# Patient Record
Sex: Female | Born: 1985 | Race: Black or African American | Hispanic: No | Marital: Married | Smoking: Never smoker
Health system: Southern US, Community
[De-identification: ages and names within clinical notes are randomized; demographics above are authoritative.]

## PROBLEM LIST (undated history)

## (undated) DIAGNOSIS — F32A Depression, unspecified: Secondary | ICD-10-CM

## (undated) DIAGNOSIS — D649 Anemia, unspecified: Secondary | ICD-10-CM

## (undated) DIAGNOSIS — F329 Major depressive disorder, single episode, unspecified: Secondary | ICD-10-CM

## (undated) DIAGNOSIS — R519 Headache, unspecified: Secondary | ICD-10-CM

## (undated) HISTORY — DX: Headache, unspecified: R51.9

## (undated) HISTORY — DX: Depression, unspecified: F32.A

## (undated) HISTORY — PX: NO PAST SURGERIES: SHX2092

---
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
## 2018-09-25 LAB — OB RESULTS CONSOLE HEPATITIS B SURFACE ANTIGEN: Hepatitis B Surface Ag: NEGATIVE

## 2018-09-25 LAB — OB RESULTS CONSOLE ANTIBODY SCREEN: Antibody Screen: NEGATIVE

## 2018-09-25 LAB — OB RESULTS CONSOLE ABO/RH: RH Type: POSITIVE

## 2018-09-25 LAB — OB RESULTS CONSOLE RUBELLA ANTIBODY, IGM: Rubella: IMMUNE

## 2018-09-25 LAB — OB RESULTS CONSOLE GC/CHLAMYDIA
Chlamydia: NEGATIVE
Gonorrhea: NEGATIVE

## 2018-09-25 LAB — OB RESULTS CONSOLE RPR: RPR: NONREACTIVE

## 2018-09-25 LAB — OB RESULTS CONSOLE HIV ANTIBODY (ROUTINE TESTING): HIV: NONREACTIVE

## 2018-11-13 NOTE — L&D Delivery Note (Signed)
Delivery Note At 2:54 PM a viable female was delivered via  (Presentation: LOA).  APGAR: 9, 9; weight pending.   Placenta status: S, I. 3V Cord with the following complications: none.  Cord pH: n/a  Anesthesia:  CLEA Episiotomy:  n/a Lacerations:  none Suture Repair: n/a Est. Blood Loss (mL):  100  Mom to postpartum.  Baby to Couplet care / Skin to Skin.  Linda Hedges 04/28/2019, 3:15 PM

## 2018-11-15 ENCOUNTER — Inpatient Hospital Stay (HOSPITAL_COMMUNITY)
Admission: AD | Admit: 2018-11-15 | Discharge: 2018-11-15 | Disposition: A | Discharge status: Home / Self Care | Payer: 59 | Source: Ambulatory Visit | Attending: Obstetrics and Gynecology | Admitting: Obstetrics and Gynecology

## 2018-11-15 ENCOUNTER — Other Ambulatory Visit: Payer: Self-pay

## 2018-11-15 ENCOUNTER — Encounter (HOSPITAL_COMMUNITY): Payer: Self-pay | Admitting: *Deleted

## 2018-11-15 DIAGNOSIS — Z3A16 16 weeks gestation of pregnancy: Secondary | ICD-10-CM | POA: Insufficient documentation

## 2018-11-15 DIAGNOSIS — O26892 Other specified pregnancy related conditions, second trimester: Secondary | ICD-10-CM | POA: Diagnosis not present

## 2018-11-15 DIAGNOSIS — R102 Pelvic and perineal pain: Secondary | ICD-10-CM | POA: Insufficient documentation

## 2018-11-15 DIAGNOSIS — O26899 Other specified pregnancy related conditions, unspecified trimester: Secondary | ICD-10-CM | POA: Diagnosis present

## 2018-11-15 HISTORY — DX: Anemia, unspecified: D64.9

## 2018-11-15 LAB — URINALYSIS, ROUTINE W REFLEX MICROSCOPIC
Bacteria, UA: NONE SEEN
Bilirubin Urine: NEGATIVE
Glucose, UA: NEGATIVE mg/dL
Hgb urine dipstick: NEGATIVE
Ketones, ur: NEGATIVE mg/dL
Nitrite: NEGATIVE
Protein, ur: NEGATIVE mg/dL
Specific Gravity, Urine: 1.019 (ref 1.005–1.030)
pH: 6 (ref 5.0–8.0)

## 2018-11-15 MED ORDER — OXYCODONE-ACETAMINOPHEN 5-325 MG PO TABS: 1.0000 | ORAL_TABLET | Freq: Four times a day (QID) | ORAL | 0 refills | Status: DC | PRN

## 2018-11-15 MED ORDER — ACETAMINOPHEN 500 MG PO TABS
1000.0000 mg | ORAL_TABLET | Freq: Once | ORAL | Status: AC
  Administered 2018-11-15: 1000 mg via ORAL
  Filled 2018-11-15: qty 2

## 2018-11-15 NOTE — Discharge Instructions (Signed)
You have been sent a prescription for a stronger pain medication to use on a as needed basis for when the pain is NOT relieved by Tylenol. Be sure not to use in combination with Tylenol or Fioricet, because all 3 of these medications contain Tylenol.  Round Ligament Pain During Pregnancy Round ligament pain is a sharp pain or jabbing feeling often felt in the lower belly or groin area on one or both sides. It is one of the most common complaints during pregnancy and is considered a normal part of pregnancy. It is most often felt during the second trimester.  Here is what you need to know about round ligament pain, including some tips to help you feel better.  Causes of Round Ligament Pain  Several thick ligaments surround and support your womb (uterus) as it grows during pregnancy. One of them is called the round ligament.  The round ligament connects the front part of the womb to your groin, the area where your legs attach to your pelvis. The round ligament normally tightens and relaxes slowly.  As your baby and womb grow, the round ligament stretches. That makes it more likely to become strained.  Sudden movements can cause the ligament to tighten quickly, like a rubber band snapping. This causes a sudden and quick jabbing feeling.  Symptoms of Round Ligament Pain  Round ligament pain can be concerning and uncomfortable. But it is considered normal as your body changes during pregnancy.  The symptoms of round ligament pain include a sharp, sudden spasm in the belly. It usually affects the right side, but it may happen on both sides. The pain only lasts a few seconds.  Exercise may cause the pain, as will rapid movements such as:  sneezing coughing laughing rolling over in bed standing up too quickly  Comfort Measures:  Soak in a warm tub Tylenol 1000 mg by mouth every 6-8 hrs as needed for pain Slow position changes Laying on the affected side Massage lower pelvic region before  getting out of bed every morning and after going to bed at night Sit with one leg on a chair and the other leg off the chair stretched behind you to stretch the groin area; switch legs and stretch the other groin area; do this twice daily

## 2018-11-15 NOTE — MAU Provider Note (Signed)
History     CSN: [id number]  Arrival date and time: 11/15/18 7829   First Provider Initiated Contact with Patient 11/15/18 2109      Chief Complaint  Patient presents with  . Abdominal Pain   HPI  Ms.  Maureen Bradshaw is a 33 y.o. year old G66P1001 female at [redacted]w[redacted]d weeks gestation who presents to MAU reporting mid lower abdominal pain/cramping since yesterday at work. She reports the pain is mainly when she stands and walking. She denies VB or LOF. She is scheduled to see Dr. Langston Masker on Thursday 11/21/2018.  Past Medical History:  Diagnosis Date  . Anemia     Past Surgical History:  Procedure Laterality Date  . NO PAST SURGERIES      No family history on file.  Social History   Tobacco Use  . Smoking status: Never Smoker  . Smokeless tobacco: Never Used  Substance Use Topics  . Alcohol use: Never    Frequency: Never  . Drug use: Never    Allergies: No Known Allergies  No medications prior to admission.    Review of Systems  Constitutional: Negative.   HENT: Negative.   Eyes: Negative.   Respiratory: Negative.   Cardiovascular: Negative.   Gastrointestinal: Negative.   Endocrine: Negative.   Genitourinary: Positive for pelvic pain (lower, pressure, increases with standing and walking). Negative for vaginal bleeding.  Musculoskeletal: Negative.   Skin: Negative.   Allergic/Immunologic: Negative.   Neurological: Negative.   Hematological: Negative.   Psychiatric/Behavioral: Negative.    Physical Exam   Blood pressure (!) 125/55, pulse 75, temperature 98.6 F (37 C), temperature source Oral, resp. rate 18, weight 93.8 kg, SpO2 100 %.  Physical Exam  Nursing note and vitals reviewed. Constitutional: She is oriented to person, place, and time. She appears well-developed and well-nourished.  HENT:  Head: Normocephalic and atraumatic.  Eyes: Pupils are equal, round, and reactive to light.  Neck: Normal range of motion.  Cardiovascular: Normal rate and  regular rhythm.  Respiratory: Effort normal.  GI: Soft.  Genitourinary:    Genitourinary Comments: Mild tenderness with palpation in lower mid pelvic area, pt declines WP & GC/CT   Musculoskeletal: Normal range of motion.  Neurological: She is alert and oriented to person, place, and time.  Skin: Skin is warm and dry.  Psychiatric: She has a normal mood and affect. Her behavior is normal. Judgment and thought content normal.    MAU Course  Procedures Informal BS U/S Patient informed that the ultrasound is considered a limited OB ultrasound and is not intended to be a complete ultrasound exam.  Patient also informed that the ultrasound is not being completed with the intent of assessing for fetal or placental anomalies or any pelvic abnormalities.  Explained that the purpose of today's ultrasound is to assess for viability - very active fetus seen. Parents reassured.  Patient acknowledges the purpose of the exam and the limitations of the study.  MDM CCUA FHTs by doppler: 152 bpm  BS U/S  Tylenol 1000 mg   Results for orders placed or performed during the hospital encounter of 11/15/18 (from the past 24 hour(s))  Urinalysis, Routine w reflex microscopic     Status: Abnormal   Collection Time: 11/15/18  6:50 PM  Result Value Ref Range   Color, Urine YELLOW YELLOW   APPearance HAZY (A) CLEAR   Specific Gravity, Urine 1.019 1.005 - 1.030   pH 6.0 5.0 - 8.0   Glucose, UA NEGATIVE NEGATIVE mg/dL  Hgb urine dipstick NEGATIVE NEGATIVE   Bilirubin Urine NEGATIVE NEGATIVE   Ketones, ur NEGATIVE NEGATIVE mg/dL   Protein, ur NEGATIVE NEGATIVE mg/dL   Nitrite NEGATIVE NEGATIVE   Leukocytes, UA SMALL (A) NEGATIVE   RBC / HPF 0-5 0 - 5 RBC/hpf   WBC, UA 11-20 0 - 5 WBC/hpf   Bacteria, UA NONE SEEN NONE SEEN   Squamous Epithelial / LPF 6-10 0 - 5   Mucus PRESENT      Assessment and Plan  Pain of round ligament affecting pregnancy, antepartum  - Rx for Percocet 5/325 mg for if Tylenol  dosing does not help RLP - Comfort measures discussed for RLP, demonstrations given - Information provided on RLP & safe meds in pregnancy list  - Discharge patient - Keep scheduled appt with P4W on Thursday 11/21/2018 - Patient verbalized an understanding of the plan of care and agrees.    Raelyn Mora, MSN, CNM 11/15/2018, 9:09 PM

## 2018-11-15 NOTE — MAU Note (Signed)
Urine in lab 

## 2018-11-15 NOTE — MAU Note (Signed)
Been having a lot of discomfort in lower abd, started yesterday when at work.  Mainly when she is standing and walking.

## 2019-01-31 ENCOUNTER — Other Ambulatory Visit (HOSPITAL_COMMUNITY): Payer: Self-pay | Admitting: *Deleted

## 2019-02-03 ENCOUNTER — Ambulatory Visit (HOSPITAL_COMMUNITY)
Admission: RE | Admit: 2019-02-03 | Discharge: 2019-02-03 | Disposition: A | Discharge status: Home / Self Care | Payer: 59 | Source: Ambulatory Visit | Attending: Obstetrics and Gynecology | Admitting: Obstetrics and Gynecology

## 2019-02-03 ENCOUNTER — Other Ambulatory Visit: Payer: Self-pay

## 2019-02-03 DIAGNOSIS — D509 Iron deficiency anemia, unspecified: Secondary | ICD-10-CM | POA: Insufficient documentation

## 2019-02-03 MED ORDER — SODIUM CHLORIDE 0.9 % IV SOLN
510.0000 mg | INTRAVENOUS | Status: DC
  Administered 2019-02-03: 510 mg via INTRAVENOUS
  Filled 2019-02-03: qty 510

## 2019-02-03 NOTE — Discharge Instructions (Signed)

## 2019-02-06 ENCOUNTER — Ambulatory Visit (HOSPITAL_COMMUNITY)
Admission: RE | Admit: 2019-02-06 | Discharge: 2019-02-06 | Disposition: A | Discharge status: Home / Self Care | Payer: 59 | Source: Ambulatory Visit | Attending: Obstetrics and Gynecology | Admitting: Obstetrics and Gynecology

## 2019-02-06 ENCOUNTER — Other Ambulatory Visit: Payer: Self-pay

## 2019-02-06 DIAGNOSIS — D509 Iron deficiency anemia, unspecified: Secondary | ICD-10-CM | POA: Diagnosis not present

## 2019-02-06 DIAGNOSIS — O99019 Anemia complicating pregnancy, unspecified trimester: Secondary | ICD-10-CM | POA: Insufficient documentation

## 2019-02-06 MED ORDER — SODIUM CHLORIDE 0.9 % IV SOLN
510.0000 mg | INTRAVENOUS | Status: AC
  Administered 2019-02-06: 510 mg via INTRAVENOUS
  Filled 2019-02-06: qty 17

## 2019-02-12 ENCOUNTER — Encounter (HOSPITAL_COMMUNITY): Payer: 59

## 2019-04-04 LAB — OB RESULTS CONSOLE GBS: GBS: POSITIVE

## 2019-04-15 ENCOUNTER — Telehealth (HOSPITAL_COMMUNITY): Payer: Self-pay | Admitting: *Deleted

## 2019-04-15 ENCOUNTER — Encounter (HOSPITAL_COMMUNITY): Payer: Self-pay | Admitting: *Deleted

## 2019-04-16 ENCOUNTER — Encounter (HOSPITAL_COMMUNITY): Payer: Self-pay | Admitting: *Deleted

## 2019-04-16 NOTE — Telephone Encounter (Signed)
Preadmission screen  

## 2019-04-24 ENCOUNTER — Other Ambulatory Visit (HOSPITAL_COMMUNITY)
Admission: RE | Admit: 2019-04-24 | Discharge: 2019-04-24 | Disposition: A | Discharge status: Home / Self Care | Payer: 59 | Source: Ambulatory Visit | Attending: Obstetrics and Gynecology | Admitting: Obstetrics and Gynecology

## 2019-04-24 ENCOUNTER — Other Ambulatory Visit: Payer: Self-pay

## 2019-04-24 DIAGNOSIS — Z1159 Encounter for screening for other viral diseases: Secondary | ICD-10-CM | POA: Diagnosis present

## 2019-04-24 NOTE — MAU Note (Signed)
Asymptomatic, swab collected without problem. 

## 2019-04-25 ENCOUNTER — Other Ambulatory Visit (HOSPITAL_COMMUNITY): Payer: Self-pay | Admitting: *Deleted

## 2019-04-25 LAB — NOVEL CORONAVIRUS, NAA (HOSP ORDER, SEND-OUT TO REF LAB; TAT 18-24 HRS): SARS-CoV-2, NAA: NOT DETECTED

## 2019-04-28 ENCOUNTER — Inpatient Hospital Stay (HOSPITAL_COMMUNITY)
Admission: AD | Admit: 2019-04-28 | Discharge: 2019-04-29 | DRG: 807 | Disposition: A | Discharge status: Home / Self Care | Payer: No Typology Code available for payment source | Attending: Obstetrics & Gynecology | Admitting: Obstetrics & Gynecology

## 2019-04-28 ENCOUNTER — Inpatient Hospital Stay (HOSPITAL_COMMUNITY): Payer: No Typology Code available for payment source | Admitting: Anesthesiology

## 2019-04-28 ENCOUNTER — Inpatient Hospital Stay (HOSPITAL_COMMUNITY): Payer: No Typology Code available for payment source

## 2019-04-28 ENCOUNTER — Encounter (HOSPITAL_COMMUNITY): Payer: Self-pay | Admitting: *Deleted

## 2019-04-28 ENCOUNTER — Other Ambulatory Visit: Payer: Self-pay

## 2019-04-28 DIAGNOSIS — O99824 Streptococcus B carrier state complicating childbirth: Principal | ICD-10-CM | POA: Diagnosis present

## 2019-04-28 DIAGNOSIS — Z3A39 39 weeks gestation of pregnancy: Secondary | ICD-10-CM

## 2019-04-28 DIAGNOSIS — O26893 Other specified pregnancy related conditions, third trimester: Secondary | ICD-10-CM | POA: Diagnosis present

## 2019-04-28 DIAGNOSIS — Z3490 Encounter for supervision of normal pregnancy, unspecified, unspecified trimester: Secondary | ICD-10-CM

## 2019-04-28 LAB — ABO/RH: ABO/RH(D): O POS

## 2019-04-28 LAB — TYPE AND SCREEN
ABO/RH(D): O POS
Antibody Screen: NEGATIVE

## 2019-04-28 LAB — CBC
HCT: 34.7 % — ABNORMAL LOW (ref 36.0–46.0)
Hemoglobin: 11.3 g/dL — ABNORMAL LOW (ref 12.0–15.0)
MCH: 26.7 pg (ref 26.0–34.0)
MCHC: 32.6 g/dL (ref 30.0–36.0)
MCV: 82 fL (ref 80.0–100.0)
Platelets: 249 10*3/uL (ref 150–400)
RBC: 4.23 MIL/uL (ref 3.87–5.11)
RDW: 16.5 % — ABNORMAL HIGH (ref 11.5–15.5)
WBC: 7.5 10*3/uL (ref 4.0–10.5)
nRBC: 0 % (ref 0.0–0.2)

## 2019-04-28 MED ORDER — PHENYLEPHRINE 40 MCG/ML (10ML) SYRINGE FOR IV PUSH (FOR BLOOD PRESSURE SUPPORT): 80.0000 ug | PREFILLED_SYRINGE | INTRAVENOUS | Status: DC | PRN

## 2019-04-28 MED ORDER — FLEET ENEMA 7-19 GM/118ML RE ENEM: 1.0000 | ENEMA | RECTAL | Status: DC | PRN

## 2019-04-28 MED ORDER — OXYTOCIN 40 UNITS IN NORMAL SALINE INFUSION - SIMPLE MED: 2.5000 [IU]/h | INTRAVENOUS | Status: DC

## 2019-04-28 MED ORDER — ACETAMINOPHEN 325 MG PO TABS: 650.0000 mg | ORAL_TABLET | ORAL | Status: DC | PRN

## 2019-04-28 MED ORDER — IBUPROFEN 600 MG PO TABS
600.0000 mg | ORAL_TABLET | Freq: Four times a day (QID) | ORAL | Status: DC
  Administered 2019-04-28 – 2019-04-29 (×5): 600 mg via ORAL
  Filled 2019-04-28 (×5): qty 1

## 2019-04-28 MED ORDER — LIDOCAINE HCL (PF) 1 % IJ SOLN: 30.0000 mL | INTRAMUSCULAR | Status: DC | PRN

## 2019-04-28 MED ORDER — OXYCODONE-ACETAMINOPHEN 5-325 MG PO TABS: 2.0000 | ORAL_TABLET | ORAL | Status: DC | PRN

## 2019-04-28 MED ORDER — DIPHENHYDRAMINE HCL 25 MG PO CAPS: 25.0000 mg | ORAL_CAPSULE | Freq: Four times a day (QID) | ORAL | Status: DC | PRN

## 2019-04-28 MED ORDER — DIBUCAINE (PERIANAL) 1 % EX OINT: 1.0000 "application " | TOPICAL_OINTMENT | CUTANEOUS | Status: DC | PRN

## 2019-04-28 MED ORDER — COCONUT OIL OIL
1.0000 "application " | TOPICAL_OIL | Status: DC | PRN
  Administered 2019-04-29: 1 via TOPICAL

## 2019-04-28 MED ORDER — OXYCODONE-ACETAMINOPHEN 5-325 MG PO TABS: 1.0000 | ORAL_TABLET | ORAL | Status: DC | PRN

## 2019-04-28 MED ORDER — LACTATED RINGERS IV SOLN: 500.0000 mL | INTRAVENOUS | Status: DC | PRN

## 2019-04-28 MED ORDER — SODIUM CHLORIDE (PF) 0.9 % IJ SOLN
INTRAMUSCULAR | Status: DC | PRN
  Administered 2019-04-28: 12 mL/h via EPIDURAL

## 2019-04-28 MED ORDER — SOD CITRATE-CITRIC ACID 500-334 MG/5ML PO SOLN: 30.0000 mL | ORAL | Status: DC | PRN

## 2019-04-28 MED ORDER — OXYTOCIN BOLUS FROM INFUSION
500.0000 mL | Freq: Once | INTRAVENOUS | Status: AC
  Administered 2019-04-28: 500 mL via INTRAVENOUS

## 2019-04-28 MED ORDER — SENNOSIDES-DOCUSATE SODIUM 8.6-50 MG PO TABS
2.0000 | ORAL_TABLET | ORAL | Status: DC
  Administered 2019-04-29: 01:00:00 2 via ORAL
  Filled 2019-04-28: qty 2

## 2019-04-28 MED ORDER — PRENATAL MULTIVITAMIN CH
1.0000 | ORAL_TABLET | Freq: Every day | ORAL | Status: DC
  Administered 2019-04-29: 11:00:00 1 via ORAL
  Filled 2019-04-28: qty 1

## 2019-04-28 MED ORDER — TETANUS-DIPHTH-ACELL PERTUSSIS 5-2.5-18.5 LF-MCG/0.5 IM SUSP: 0.5000 mL | Freq: Once | INTRAMUSCULAR | Status: DC

## 2019-04-28 MED ORDER — BENZOCAINE-MENTHOL 20-0.5 % EX AERO
1.0000 "application " | INHALATION_SPRAY | CUTANEOUS | Status: DC | PRN
  Administered 2019-04-28: 1 via TOPICAL
  Filled 2019-04-28: qty 56

## 2019-04-28 MED ORDER — EPHEDRINE 5 MG/ML INJ: 10.0000 mg | INTRAVENOUS | Status: DC | PRN

## 2019-04-28 MED ORDER — LACTATED RINGERS IV SOLN: 500.0000 mL | Freq: Once | INTRAVENOUS | Status: DC

## 2019-04-28 MED ORDER — FENTANYL CITRATE (PF) 100 MCG/2ML IJ SOLN
50.0000 ug | INTRAMUSCULAR | Status: DC | PRN
  Administered 2019-04-28: 14:00:00 100 ug via INTRAVENOUS
  Filled 2019-04-28: qty 2

## 2019-04-28 MED ORDER — LACTATED RINGERS IV SOLN
INTRAVENOUS | Status: DC
  Administered 2019-04-28 (×2): via INTRAVENOUS

## 2019-04-28 MED ORDER — SIMETHICONE 80 MG PO CHEW: 80.0000 mg | CHEWABLE_TABLET | ORAL | Status: DC | PRN

## 2019-04-28 MED ORDER — ONDANSETRON HCL 4 MG/2ML IJ SOLN: 4.0000 mg | Freq: Four times a day (QID) | INTRAMUSCULAR | Status: DC | PRN

## 2019-04-28 MED ORDER — OXYTOCIN 40 UNITS IN NORMAL SALINE INFUSION - SIMPLE MED
1.0000 m[IU]/min | INTRAVENOUS | Status: DC
  Administered 2019-04-28: 08:00:00 2 m[IU]/min via INTRAVENOUS
  Filled 2019-04-28: qty 1000

## 2019-04-28 MED ORDER — TERBUTALINE SULFATE 1 MG/ML IJ SOLN: 0.2500 mg | Freq: Once | INTRAMUSCULAR | Status: DC | PRN

## 2019-04-28 MED ORDER — SODIUM CHLORIDE 0.9 % IV SOLN
5.0000 10*6.[IU] | Freq: Once | INTRAVENOUS | Status: AC
  Administered 2019-04-28: 08:00:00 5 10*6.[IU] via INTRAVENOUS
  Filled 2019-04-28: qty 5

## 2019-04-28 MED ORDER — FENTANYL-BUPIVACAINE-NACL 0.5-0.125-0.9 MG/250ML-% EP SOLN
12.0000 mL/h | EPIDURAL | Status: DC | PRN
  Filled 2019-04-28: qty 250

## 2019-04-28 MED ORDER — FENTANYL CITRATE (PF) 100 MCG/2ML IJ SOLN
INTRAMUSCULAR | Status: DC | PRN
  Administered 2019-04-28: 100 ug via EPIDURAL

## 2019-04-28 MED ORDER — ONDANSETRON HCL 4 MG PO TABS: 4.0000 mg | ORAL_TABLET | ORAL | Status: DC | PRN

## 2019-04-28 MED ORDER — BUPIVACAINE HCL (PF) 0.25 % IJ SOLN
INTRAMUSCULAR | Status: DC | PRN
  Administered 2019-04-28: 8 mL via EPIDURAL

## 2019-04-28 MED ORDER — ONDANSETRON HCL 4 MG/2ML IJ SOLN: 4.0000 mg | INTRAMUSCULAR | Status: DC | PRN

## 2019-04-28 MED ORDER — ZOLPIDEM TARTRATE 5 MG PO TABS: 5.0000 mg | ORAL_TABLET | Freq: Every evening | ORAL | Status: DC | PRN

## 2019-04-28 MED ORDER — WITCH HAZEL-GLYCERIN EX PADS: 1.0000 "application " | MEDICATED_PAD | CUTANEOUS | Status: DC | PRN

## 2019-04-28 MED ORDER — LIDOCAINE HCL (PF) 1 % IJ SOLN
INTRAMUSCULAR | Status: DC | PRN
  Administered 2019-04-28: 5 mL via EPIDURAL

## 2019-04-28 MED ORDER — PENICILLIN G 3 MILLION UNITS IVPB - SIMPLE MED
3.0000 10*6.[IU] | INTRAVENOUS | Status: DC
  Administered 2019-04-28: 12:00:00 3 10*6.[IU] via INTRAVENOUS
  Filled 2019-04-28: qty 100

## 2019-04-28 MED ORDER — DIPHENHYDRAMINE HCL 50 MG/ML IJ SOLN: 12.5000 mg | INTRAMUSCULAR | Status: DC | PRN

## 2019-04-28 NOTE — Lactation Note (Addendum)
This note was copied from a baby's chart. Lactation Consultation Note  Patient Name: Maureen Bradshaw [id number] Date: 04/28/2019 Reason for consult: 1st time breastfeeding;Term P2, 5 hour female infant. Mom breastfeeding 3 x times since birth.  Per mom, infant had 3 stools and one void. Mom feels breastfeeding is going well. This is Mom's first time breastfeeding she formula feed her 35 year old son, due to  lack of support with  nursing staff not  being helpful, Mom  delivered her son at a different hospital. Mom is active on the [facility] program in [facility] and has an DEBP at home.  Mom knows how to hand express her Nurse helped her with learning how to hand express. Mom latched infant on left breast using the cross cradle hold, infant latched chin first with wide mouth gape, nose slightly touching breast. Few swallows observed by [place] infant was still breastfeeding (5 minutes) when [facility] left the room.  Mom knows to breastfeed according hunger cues, 8 to 12 times within 24 hours and on demand. LC discussed I & O. Mom knows to call Nurse or [place] if she has any questions, concerns or need assistance with latching infant to breast. Mom made aware of O/P services, breastfeeding support groups, community resources, and our phone # for post-discharge questions.  Maternal Data Formula Feeding for Exclusion: No Has patient been taught Hand Expression?: Yes Does the patient have breastfeeding experience prior to this delivery?: No  Feeding Feeding Type: Breast Fed  LATCH Score Latch: Grasps breast easily, tongue down, lips flanged, rhythmical sucking.  Audible Swallowing: Spontaneous and intermittent  Type of Nipple: Everted at rest and after stimulation  Comfort (Breast/Nipple): Soft / non-tender  Hold (Positioning): Assistance needed to correctly position infant at breast and maintain latch.  LATCH Score: 9  Interventions Interventions: Breast feeding basics reviewed;Breast  compression;Assisted with latch;Skin to skin;Position options;Support pillows;Adjust position  Lactation Tools Discussed/Used WIC Program: Yes   Consult Status Consult Status: Follow-up Date: 04/29/19 Follow-up type: In-patient    Vicente Serene 04/28/2019, 8:40 PM

## 2019-04-28 NOTE — H&P (Signed)
Maureen Bradshaw is a 33 y.o. female G2P1001 at 53 weeks presenting for elective IOL.  Antepartum course uncomplicated.  H/O PPD.  GBS positive.  OB History    Gravida  2   Para  1   Term  1   Preterm      AB      Living  1     SAB      TAB      Ectopic      Multiple      Live Births  1          Past Medical History:  Diagnosis Date  . Anemia   . Depression    hx PPD untreated  . Headache    Past Surgical History:  Procedure Laterality Date  . NO PAST SURGERIES     Family History: family history includes Breast cancer in her mother; Heart disease in her father; Hypertension in her father and mother; Lung cancer in her paternal grandfather; Thyroid disease in her mother. Social History:  reports that she has never smoked. She has never used smokeless tobacco. She reports that she does not drink alcohol or use drugs.     Maternal Diabetes: No Genetic Screening: Normal Maternal Ultrasounds/Referrals: Normal Fetal Ultrasounds or other Referrals:  None Maternal Substance Abuse:  No Significant Maternal Medications:  None Significant Maternal Lab Results:  Lab values include: Group B Strep positive Other Comments:  None  ROS Maternal Medical History:  Contractions: Onset was 1-2 hours ago.   Frequency: irregular.   Perceived severity is mild.    Fetal activity: Perceived fetal activity is normal.   Last perceived fetal movement was within the past hour.    Prenatal complications: no prenatal complications Prenatal Complications - Diabetes: none.    Dilation: 3.5 Effacement (%): 60 Station: -2 Exam by:: J.Cox, RN Blood pressure 116/66, pulse 67, height 5\' 6"  (1.676 m), weight 95.8 kg, last menstrual period 07/26/2018. Maternal Exam:  Uterine Assessment: Contraction strength is mild.  Contraction frequency is rare.   Abdomen: Fundal height is c/w dates.   Estimated fetal weight is 7#8.       Fetal Exam Fetal Monitor Review: Mode: ultrasound.    Baseline rate: 145.  Variability: moderate (6-25 bpm).   Pattern: no decelerations and accelerations present.    Fetal State Assessment: Category I - tracings are normal.     Physical Exam  Constitutional: She is oriented to person, place, and time. She appears well-developed and well-nourished.  GI: Soft. There is no abdominal tenderness. There is no rebound and no guarding.  Neurological: She is alert and oriented to person, place, and time.  Skin: Skin is warm and dry.  Psychiatric: She has a normal mood and affect. Her behavior is normal.    Prenatal labs: ABO, Rh: O/Positive/-- (11/13 0000) Antibody: Negative (11/13 0000) Rubella: Immune (11/13 0000) RPR: Nonreactive (11/13 0000)  HBsAg: Negative (11/13 0000)  HIV: Non-reactive (11/13 0000)  GBS: Positive (05/22 0000)   Assessment/Plan: 33yo G2P1001 at 39w for elective IOL -GBS positive-PCN for ppx -Epidural when ready -AROM after first dose abx infused -Anticipate NSVD   Linda Hedges 04/28/2019, 8:17 AM

## 2019-04-28 NOTE — Progress Notes (Signed)
Comfortable with CLEA SVE 3-4/50/-2, AROM, scant clear fluid Continue pitocin FHT Cat I  Linda Hedges, DO

## 2019-04-28 NOTE — Anesthesia Procedure Notes (Signed)

## 2019-04-28 NOTE — Anesthesia Preprocedure Evaluation (Signed)
Anesthesia Evaluation  Patient identified by MRN, date of birth, ID band Patient awake    Reviewed: Allergy & Precautions, NPO status   Airway Mallampati: II  TM Distance: >3 FB Neck ROM: Full    Dental no notable dental hx.    Pulmonary neg pulmonary ROS,    Pulmonary exam normal breath sounds clear to auscultation       Cardiovascular Exercise Tolerance: Good negative cardio ROS Normal cardiovascular exam Rhythm:Regular Rate:Normal     Neuro/Psych  Headaches, Depression    GI/Hepatic   Endo/Other    Renal/GU      Musculoskeletal   Abdominal   Peds  Hematology Hgb 11.3 Plt 249   Anesthesia Other Findings   Reproductive/Obstetrics (+) Pregnancy                             Anesthesia Physical Anesthesia Plan  ASA: II  Anesthesia Plan: Epidural   Post-op Pain Management:    Induction:   PONV Risk Score and Plan:   Airway Management Planned:   Additional Equipment:   Intra-op Plan:   Post-operative Plan:   Informed Consent: I have reviewed the patients History and Physical, chart, labs and discussed the procedure including the risks, benefits and alternatives for the proposed anesthesia with the patient or authorized representative who has indicated his/her understanding and acceptance.       Plan Discussed with:   Anesthesia Plan Comments: (For LEA)        Anesthesia Quick Evaluation

## 2019-04-29 ENCOUNTER — Ambulatory Visit: Payer: Self-pay

## 2019-04-29 LAB — CBC
HCT: 31.9 % — ABNORMAL LOW (ref 36.0–46.0)
Hemoglobin: 10.1 g/dL — ABNORMAL LOW (ref 12.0–15.0)
MCH: 26.2 pg (ref 26.0–34.0)
MCHC: 31.7 g/dL (ref 30.0–36.0)
MCV: 82.9 fL (ref 80.0–100.0)
Platelets: 205 10*3/uL (ref 150–400)
RBC: 3.85 MIL/uL — ABNORMAL LOW (ref 3.87–5.11)
RDW: 16.4 % — ABNORMAL HIGH (ref 11.5–15.5)
WBC: 10.1 10*3/uL (ref 4.0–10.5)
nRBC: 0 % (ref 0.0–0.2)

## 2019-04-29 LAB — RPR: RPR Ser Ql: NONREACTIVE

## 2019-04-29 NOTE — Lactation Note (Signed)
This note was copied from a baby's chart. Lactation Consultation Note  Patient Name: Maureen Bradshaw [id number] Date: 04/29/2019   Infant has bili blanket for bili level in high risk zone. Mom had attempted to latch 30 minutes prior to consult, but "Maureen Bradshaw" just wanted to sleep.   Mom reported that when infant last received a bottle, the flow seemed a little fast. I observed infant with the Similac slow-flow nipple & it did seem too fast. I tried with the Enfamil extra slow-flow nipple & "Maureen Bradshaw" did very well. I suggested that parents allow Maureen Bradshaw to feed in an upright position with the Enfamil extra slow-flow nipple until she is comfortably full.   Mom last pumped at 0900 this morning. Mom will pump whenever infant receives formula. She is preparing to pump now. Mom had said that her nipples were sore. I looked and it appears that her size 24 flanges would be appropriate for her when pumping. RN will bring in coconut oil so that Mom can lubricate flanges to see if that helps. Mom was shown how to assemble & use hand pump (single- & double-mode) that was included in pump kit. Mom has a Spectra 2 pump at home.   [name], RN was given update.   Matthias Hughs [facility] 04/29/2019, 12:33 PM

## 2019-04-29 NOTE — Anesthesia Postprocedure Evaluation (Signed)
Anesthesia Post Note  Patient: Maureen Bradshaw  Procedure(s) Performed: AN AD HOC LABOR EPIDURAL     Patient location during evaluation: Mother Baby Anesthesia Type: Epidural Level of consciousness: awake and alert Pain management: pain level controlled Vital Signs Assessment: post-procedure vital signs reviewed and stable Respiratory status: spontaneous breathing, nonlabored ventilation and respiratory function stable Cardiovascular status: stable Postop Assessment: no headache, no backache and epidural receding Anesthetic complications: no    Last Vitals:  Vitals:   04/29/19 0100 04/29/19 0500  BP: 109/66 110/60  Pulse: 77 70  Resp: 16 16  Temp: 36.6 C 36.7 C  SpO2: 100% 100%    Last Pain:  Vitals:   04/29/19 0500  TempSrc: Oral  PainSc: 0-No pain   Pain Goal:                   [employer]

## 2019-04-29 NOTE — Lactation Note (Signed)
This note was copied from a baby's chart. Lactation Consultation Note  Patient Name: Maureen Bradshaw Date: 04/29/2019   P2, 17 hour female infant, weight loss -5% on phototherapy. Per mom, she has been offering formula and infant has not been latching well. Per parents, infant last feed less than 1 hour  Prior to [facility] entering the room and  Infant took 35 ml of formula. LC unable to assess latch at this time.  Per mom, she has plan to supplement with formula and pump.  Mom has been giving infant EBM that she pumped first and then supplementing with formula.  Mom knows to breastfeed infant 8 to 12 times within 24 hours and on demand. Mom knows if she wants assistance with latching infant to breast to ask for [facility] services.   Maternal Data    Feeding    LATCH Score                   Interventions    Lactation Tools Discussed/Used     Consult Status      Vicente Serene 04/29/2019, 10:57 PM

## 2019-04-29 NOTE — Discharge Summary (Signed)
Obstetric Discharge Summary Reason for Admission: induction of labor Prenatal Procedures: ultrasound Intrapartum Procedures: spontaneous vaginal delivery Postpartum Procedures: none Complications-Operative and Postpartum: none Hemoglobin  Date Value Ref Range Status  04/29/2019 10.1 (L) 12.0 - 15.0 g/dL Final   HCT  Date Value Ref Range Status  04/29/2019 31.9 (L) 36.0 - 46.0 % Final    Physical Exam:  General: alert and cooperative Lochia: appropriate Uterine Fundus: firm Incision: n/a DVT Evaluation: No evidence of DVT seen on physical exam.  Discharge Diagnoses: Term Pregnancy-delivered  Discharge Information: Date: 04/29/2019 Activity: pelvic rest Diet: routine Medications: PNV and Ibuprofen Condition: stable Instructions: refer to practice specific booklet Discharge to: home Follow-up Information    [facility] Schedule an appointment as soon as possible for a visit in 6 week(s).   Contact information: [address] [place] [address] [phone]           Newborn Data: Live born female  Birth Weight: 7 lb 0.5 oz (3189 g) APGAR: 25, 9  Newborn Delivery   Birth date/time: 04/28/2019 14:54:00 Delivery type: Vaginal, Spontaneous      Home with mother.  Marylynn Pearson 04/29/2019, 8:17 AM

## 2019-04-29 NOTE — Progress Notes (Signed)
CSW received consult for history of PPD.  CSW met with MOB to offer support and complete assessment.    MOB and FOB sitting on the couch with infant asleep in basinet, when CSW entered the room. CSW introduced self and received verbal permission to complete assessment with FOB present. CSW explained reason for consult and MOB expressed understanding. MOB and FOB both appeared to be in great-spirits and were very easy to engage. CSW inquired about MOB's history of PPD and MOB acknowledged experience "a little bit" of PPD with her 64-year-old son. Per MOB, she just experienced some depression and feeling down and reported symptoms probably lasted for about a year. MOB stated she did some teleconference videos for counseling through work, at that time, and reported it was helpful. MOB denied any mental health symptoms since or during pregnancy. MOB stated she feels like she is in a different "headspace" this time.   CSW provided education regarding the baby blues period vs. perinatal mood disorders, discussed treatment and gave resources for mental health follow up if concerns arise.  CSW recommends self-evaluation during the postpartum time period using the New Mom Checklist from Postpartum Progress and encouraged MOB to contact a medical professional if symptoms are noted at any time. MOB denied any current SI or HI and reported having good support from FOB and their parents.     MOB and FOB reported having all essential items for infant once discharged and stated infant would be sleeping in a basinet once home. CSW provided review of Sudden Infant Death Syndrome (SIDS) precautions and safe sleeping habits.    MOB denied any further questions, concerns or need for resources from [place]. CSW identifies no further need for intervention and no barriers to discharge at this time.  Elijio Miles, [place]  [facility] and [employer] [phone]

## 2019-04-30 ENCOUNTER — Ambulatory Visit: Payer: Self-pay

## 2019-04-30 NOTE — Lactation Note (Signed)
This note was copied from a baby's chart. Lactation Consultation Note:  Mother reports that she just formula fed infant and that infant took 40 ml. Mother reports that she hasnt pumped in a while.  Discussed mothers goals and she reports that she is committed to pumping only and given formula until milk comes to volume.   Suggested that mother pump at least 6-8 or more times daily.  Encouraged good hand expression and breast massage.   Discussed treatment and prevention of engorgement.  Mother has DEBP at home .  Mother reports that she has a tiny scab on her left nipple. She reports soreness. She was given comfort gels . She also has coconut oil to use when pumping. Advised mother to turn pump down if feeding pain . Mother receptive to all teaching.  She was reminded that she can phone [place] office 24 hours 7 days a week. Mother is aware of all resources.   Patient Name: Maureen Bradshaw [id number] Date: 04/30/2019 Reason for consult: Follow-up assessment   Maternal Data    Feeding Feeding Type: Formula Nipple Type: Extra Slow Flow  LATCH Score                   Interventions Interventions: Comfort gels;DEBP  Lactation Tools Discussed/Used     Consult Status      Darla Lesches 04/30/2019, 11:14 AM

## 2020-02-19 ENCOUNTER — Ambulatory Visit: Payer: No Typology Code available for payment source

## 2020-02-26 ENCOUNTER — Ambulatory Visit: Payer: 59 | Attending: Family

## 2020-02-26 DIAGNOSIS — Z23 Encounter for immunization: Secondary | ICD-10-CM

## 2020-02-26 NOTE — Progress Notes (Signed)
   Covid-19 Vaccination Clinic  Name:  Marcelene Weidemann    MRN: [id number] DOB: 13-May-1986  02/26/2020  Ms. Elie was observed post Covid-19 immunization for 15 minutes without incident. She was provided with Vaccine Information Sheet and instruction to access the V-Safe system.   Ms. Rappleye was instructed to call 911 with any severe reactions post vaccine: Marland Kitchen Difficulty breathing  . Swelling of face and throat  . A fast heartbeat  . A bad rash all over body  . Dizziness and weakness   Immunizations Administered    Name Date Dose VIS Date Route   Moderna COVID-19 Vaccine 02/26/2020  4:25 PM 0.5 mL 10/14/2019 Intramuscular   Manufacturer: Moderna   Lot: [id number]   NDC: 16945-038-88

## 2020-03-30 ENCOUNTER — Ambulatory Visit: Payer: 59 | Attending: Family

## 2020-03-30 DIAGNOSIS — Z23 Encounter for immunization: Secondary | ICD-10-CM

## 2020-03-30 NOTE — Progress Notes (Signed)
   Covid-19 Vaccination Clinic  Name:  Maureen Bradshaw    MRN: [id number] DOB: 1986/10/16  03/30/2020  [name] was observed post Covid-19 immunization for 15 minutes without incident. She was provided with Vaccine Information Sheet and instruction to access the V-Safe system.   [name] was instructed to call 911 with any severe reactions post vaccine: Marland Kitchen Difficulty breathing  . Swelling of face and throat  . A fast heartbeat  . A bad rash all over body  . Dizziness and weakness   Immunizations Administered    Name Date Dose VIS Date Route   Moderna COVID-19 Vaccine 03/30/2020  2:15 PM 0.5 mL 10/2019 Intramuscular   Manufacturer: Moderna   Lot: [id number]   NDC: 84720-721-82

## 2022-01-13 ENCOUNTER — Ambulatory Visit
Admission: RE | Admit: 2022-01-13 | Discharge: 2022-01-13 | Disposition: A | Discharge status: Home / Self Care | Payer: Commercial Managed Care - PPO | Attending: Ophthalmology | Admitting: Ophthalmology

## 2022-01-13 ENCOUNTER — Ambulatory Visit
Admission: RE | Admit: 2022-01-13 | Discharge: 2022-01-13 | Disposition: A | Discharge status: Home / Self Care | Payer: Commercial Managed Care - PPO | Source: Ambulatory Visit | Attending: Ophthalmology | Admitting: Ophthalmology

## 2022-01-13 ENCOUNTER — Other Ambulatory Visit
Admission: RE | Admit: 2022-01-13 | Discharge: 2022-01-13 | Disposition: A | Discharge status: Home / Self Care | Payer: Commercial Managed Care - PPO | Source: Home / Self Care | Attending: Ophthalmology | Admitting: Ophthalmology

## 2022-01-13 ENCOUNTER — Other Ambulatory Visit: Payer: Self-pay | Admitting: Ophthalmology

## 2022-01-13 DIAGNOSIS — H15109 Unspecified episcleritis, unspecified eye: Secondary | ICD-10-CM | POA: Insufficient documentation

## 2022-01-13 LAB — CBC WITH DIFFERENTIAL/PLATELET
Abs Immature Granulocytes: 0.04 10*3/uL (ref 0.00–0.07)
Basophils Absolute: 0 10*3/uL (ref 0.0–0.1)
Basophils Relative: 0 %
Eosinophils Absolute: 0.1 10*3/uL (ref 0.0–0.5)
Eosinophils Relative: 1 %
HCT: 41.7 % (ref 36.0–46.0)
Hemoglobin: 13 g/dL (ref 12.0–15.0)
Immature Granulocytes: 1 %
Lymphocytes Relative: 25 %
Lymphs Abs: 1.9 10*3/uL (ref 0.7–4.0)
MCH: 25.8 pg — ABNORMAL LOW (ref 26.0–34.0)
MCHC: 31.2 g/dL (ref 30.0–36.0)
MCV: 82.7 fL (ref 80.0–100.0)
Monocytes Absolute: 0.3 10*3/uL (ref 0.1–1.0)
Monocytes Relative: 4 %
Neutro Abs: 5.5 10*3/uL (ref 1.7–7.7)
Neutrophils Relative %: 69 %
Platelets: 327 10*3/uL (ref 150–400)
RBC: 5.04 MIL/uL (ref 3.87–5.11)
RDW: 14 % (ref 11.5–15.5)
WBC: 7.8 10*3/uL (ref 4.0–10.5)
nRBC: 0 % (ref 0.0–0.2)

## 2022-01-13 LAB — URINALYSIS, COMPLETE (UACMP) WITH MICROSCOPIC
Bacteria, UA: NONE SEEN
Bilirubin Urine: NEGATIVE
Glucose, UA: NEGATIVE mg/dL
Ketones, ur: 20 mg/dL — AB
Nitrite: NEGATIVE
Protein, ur: NEGATIVE mg/dL
Specific Gravity, Urine: 1.025 (ref 1.005–1.030)
pH: 5 (ref 5.0–8.0)

## 2022-01-13 LAB — SEDIMENTATION RATE: Sed Rate: 5 mm/hr (ref 0–20)

## 2022-01-13 LAB — URIC ACID: Uric Acid, Serum: 5.7 mg/dL (ref 2.5–7.1)

## 2022-01-13 LAB — C-REACTIVE PROTEIN: CRP: 0.7 mg/dL (ref <1.0)

## 2022-01-14 LAB — RPR: RPR Ser Ql: NONREACTIVE

## 2022-01-14 LAB — RHEUMATOID FACTOR: Rheumatoid fact SerPl-aCnc: 10.7 IU/mL (ref <14.0)

## 2022-01-14 LAB — ANA: Anti Nuclear Antibody (ANA): NEGATIVE

## 2022-01-16 LAB — ANCA TITERS
Atypical P-ANCA titer: <1:20 {titer}
C-ANCA: <1:20 {titer}
P-ANCA: <1:20 {titer}

## 2022-01-16 LAB — QUANTIFERON-TB GOLD PLUS: QuantiFERON-TB Gold Plus: NEGATIVE

## 2022-01-16 LAB — QUANTIFERON-TB GOLD PLUS (RQFGPL)
QuantiFERON Mitogen Value: >10 IU/mL
QuantiFERON Nil Value: 0 IU/mL
QuantiFERON TB1 Ag Value: 0.01 IU/mL
QuantiFERON TB2 Ag Value: 0 IU/mL

## 2022-01-16 LAB — MISC LABCORP TEST (SEND OUT)
LabCorp test name: 82370
Labcorp test code: 82370

## 2022-01-16 LAB — LYSOZYME, SERUM: Lysozyme: 5.1 ug/mL (ref 3.3–7.1)

## 2022-01-17 LAB — ACETYLCHOLINE RECEPTOR AB, ALL
Acety choline binding ab: <0.03 nmol/L (ref 0.00–0.24)
Acetylchol Block Ab: 18 % (ref 0–25)

## 2022-01-20 LAB — MISC LABCORP TEST (SEND OUT)
LabCorp test name: 520008
Labcorp test code: 520008

## 2023-02-11 IMAGING — CR DG CHEST 2V
1 series · 2 of 2 positions shown · non-contrast
Comparison: None.

CLINICAL DATA: Episcleritis

EXAM:
CHEST - 2 VIEW

[Series 1: dg chest 2 view · 0.14mm/px · 2 of 2 slices shown]
[im 1/2]
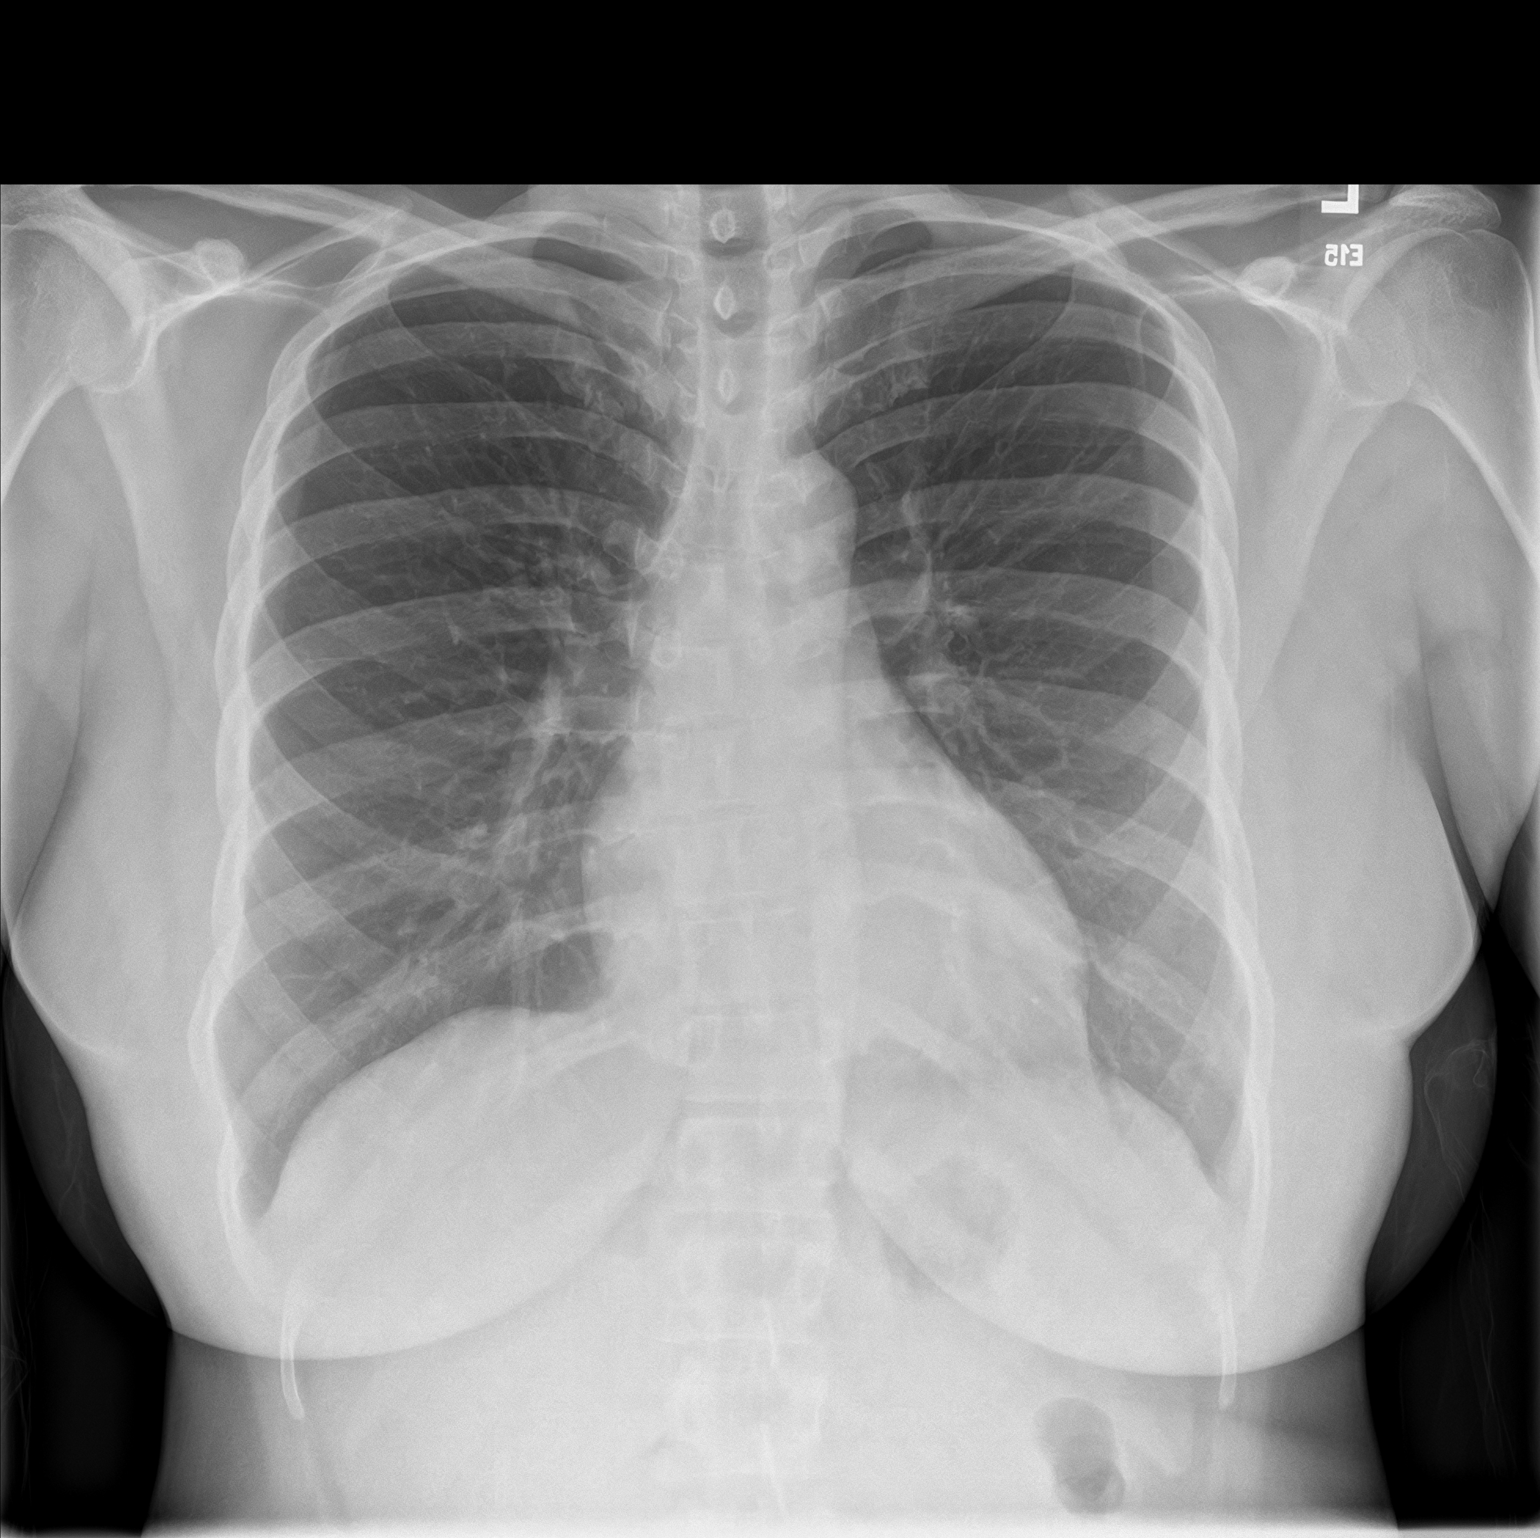
[im 2/2]
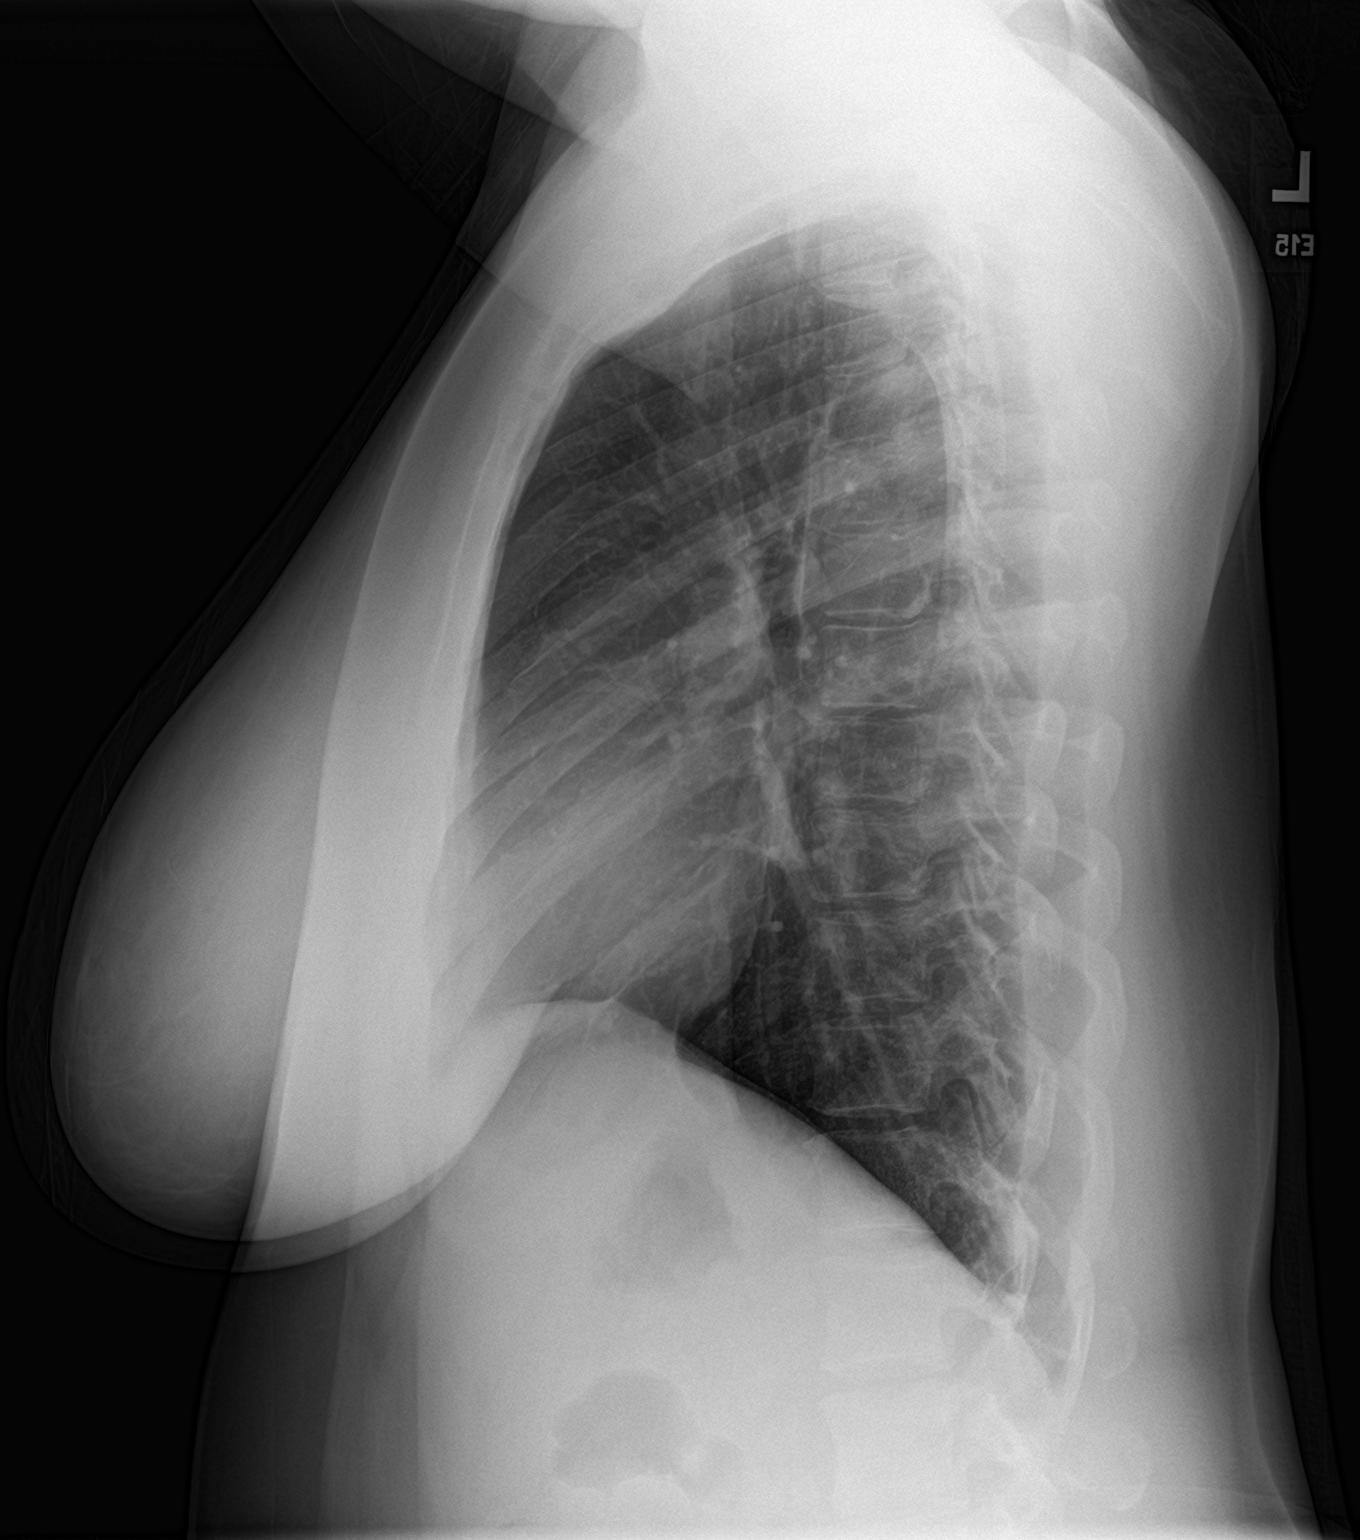

[2 of 2 positions shown; findings below may reference images not displayed]

FINDINGS: The heart size and mediastinal contours are within normal limits.
Both lungs are clear. The visualized skeletal structures are
unremarkable.
IMPRESSION: No active cardiopulmonary disease.

## 2023-09-27 ENCOUNTER — Other Ambulatory Visit: Payer: Self-pay | Admitting: Obstetrics & Gynecology

## 2023-09-27 DIAGNOSIS — R928 Other abnormal and inconclusive findings on diagnostic imaging of breast: Secondary | ICD-10-CM

## 2023-10-15 ENCOUNTER — Ambulatory Visit: Payer: Commercial Managed Care - PPO

## 2023-10-15 ENCOUNTER — Ambulatory Visit
Admission: RE | Admit: 2023-10-15 | Discharge: 2023-10-15 | Disposition: A | Discharge status: Home / Self Care | Payer: No Typology Code available for payment source | Source: Ambulatory Visit | Attending: Obstetrics & Gynecology | Admitting: Obstetrics & Gynecology

## 2023-10-15 DIAGNOSIS — R928 Other abnormal and inconclusive findings on diagnostic imaging of breast: Secondary | ICD-10-CM
